# Patient Record
Sex: Female | Born: 2000 | Race: White | Hispanic: No | Marital: Single | Smoking: Never smoker
Health system: Southern US, Community
[De-identification: ages and names within clinical notes are randomized; demographics above are authoritative.]

---
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
## 2020-03-16 ENCOUNTER — Other Ambulatory Visit: Payer: Self-pay

## 2020-03-16 ENCOUNTER — Encounter (HOSPITAL_BASED_OUTPATIENT_CLINIC_OR_DEPARTMENT_OTHER): Payer: Self-pay | Admitting: *Deleted

## 2020-03-16 ENCOUNTER — Emergency Department (HOSPITAL_BASED_OUTPATIENT_CLINIC_OR_DEPARTMENT_OTHER)
Admission: EM | Admit: 2020-03-16 | Discharge: 2020-03-17 | Disposition: A | Discharge status: Home / Self Care | Payer: Managed Care, Other (non HMO) | Attending: Emergency Medicine | Admitting: Emergency Medicine

## 2020-03-16 DIAGNOSIS — R1013 Epigastric pain: Secondary | ICD-10-CM | POA: Diagnosis present

## 2020-03-16 DIAGNOSIS — K2900 Acute gastritis without bleeding: Secondary | ICD-10-CM | POA: Insufficient documentation

## 2020-03-16 LAB — URINALYSIS, ROUTINE W REFLEX MICROSCOPIC
Bilirubin Urine: NEGATIVE
Glucose, UA: NEGATIVE mg/dL
Hgb urine dipstick: NEGATIVE
Ketones, ur: NEGATIVE mg/dL
Leukocytes,Ua: NEGATIVE
Nitrite: NEGATIVE
Protein, ur: NEGATIVE mg/dL
Specific Gravity, Urine: 1.025 (ref 1.005–1.030)
pH: 7 (ref 5.0–8.0)

## 2020-03-16 LAB — LIPASE, BLOOD: Lipase: 37 U/L (ref 11–51)

## 2020-03-16 LAB — COMPREHENSIVE METABOLIC PANEL
ALT: 11 U/L (ref 0–44)
AST: 16 U/L (ref 15–41)
Albumin: 4.4 g/dL (ref 3.5–5.0)
Alkaline Phosphatase: 50 U/L (ref 38–126)
Anion gap: 9 (ref 5–15)
BUN: 11 mg/dL (ref 6–20)
CO2: 28 mmol/L (ref 22–32)
Calcium: 9.3 mg/dL (ref 8.9–10.3)
Chloride: 103 mmol/L (ref 98–111)
Creatinine, Ser: 0.62 mg/dL (ref 0.44–1.00)
GFR, Estimated: >60 mL/min (ref >60)
Glucose, Bld: 102 mg/dL — ABNORMAL HIGH (ref 70–99)
Potassium: 3.4 mmol/L — ABNORMAL LOW (ref 3.5–5.1)
Sodium: 140 mmol/L (ref 135–145)
Total Bilirubin: 0.5 mg/dL (ref 0.3–1.2)
Total Protein: 7.3 g/dL (ref 6.5–8.1)

## 2020-03-16 LAB — CBC
HCT: 40.2 % (ref 36.0–46.0)
Hemoglobin: 13 g/dL (ref 12.0–15.0)
MCH: 30 pg (ref 26.0–34.0)
MCHC: 32.3 g/dL (ref 30.0–36.0)
MCV: 92.8 fL (ref 80.0–100.0)
Platelets: 248 10*3/uL (ref 150–400)
RBC: 4.33 MIL/uL (ref 3.87–5.11)
RDW: 13.4 % (ref 11.5–15.5)
WBC: 9.3 10*3/uL (ref 4.0–10.5)
nRBC: 0 % (ref 0.0–0.2)

## 2020-03-16 LAB — PREGNANCY, URINE: Preg Test, Ur: NEGATIVE

## 2020-03-16 MED ORDER — LIDOCAINE VISCOUS HCL 2 % MT SOLN
15.0000 mL | Freq: Once | OROMUCOSAL | Status: AC
  Administered 2020-03-16: 15 mL via ORAL
  Filled 2020-03-16: qty 15

## 2020-03-16 MED ORDER — ALUM & MAG HYDROXIDE-SIMETH 200-200-20 MG/5ML PO SUSP
30.0000 mL | Freq: Once | ORAL | Status: AC
  Administered 2020-03-16: 30 mL via ORAL
  Filled 2020-03-16: qty 30

## 2020-03-16 MED ORDER — HYOSCYAMINE SULFATE 0.125 MG SL SUBL
0.2500 mg | SUBLINGUAL_TABLET | Freq: Once | SUBLINGUAL | Status: AC
  Administered 2020-03-16: 0.25 mg via SUBLINGUAL
  Filled 2020-03-16: qty 2

## 2020-03-16 MED ORDER — ONDANSETRON HCL 4 MG/2ML IJ SOLN
4.0000 mg | Freq: Once | INTRAMUSCULAR | Status: AC
Start: 2020-03-16 — End: 2020-03-16
  Administered 2020-03-16: 4 mg via INTRAVENOUS
  Filled 2020-03-16: qty 2

## 2020-03-16 NOTE — ED Triage Notes (Signed)
Epigastric pain radiating through to back x 2 days. Denies vomiting, reports nausea

## 2020-03-16 NOTE — ED Provider Notes (Signed)
[facility] EMERGENCY DEPARTMENT Provider Note  CSN: [id number] Arrival date & time: 03/16/20 2153  Chief Complaint(s) Abdominal Pain  HPI Lori Huffman is a 19 y.o. female    HPI Here for 2 days of sharp epigastric abdominal pain Moderate in intensity Radiating to the back Nausea without emesis Worse only with palpation.  No other alleviating or aggravating factors. No change in bowel habits. No fevers or chills.  No chest pain or shortness of breath.  No cough or congestion.   Past Medical History No past medical history on file. Patient Active Problem List   Diagnosis Date Noted  . Hematuria 10/18/2018  . Dysuria 10/18/2018  . Anxiety 05/30/2018  . Acne vulgaris 05/27/2018   Home Medication(s) Prior to Admission medications   Medication Sig Start Date End Date Taking? Authorizing Provider  alum & mag hydroxide-simeth (MAALOX ADVANCED MAX ST) 400-400-40 MG/5ML suspension Take 10 mLs by mouth every 6 (six) hours as needed for indigestion. 03/17/20   CardamaAmadeo Garnet, MD  lidocaine (XYLOCAINE) 2 % solution Use as directed 15 mLs in the mouth or throat as needed for mouth pain. 03/17/20   Nira Conn, MD  phenazopyridine (PYRIDIUM) 100 MG tablet Take 1 tablet (100 mg total) by mouth 3 (three) times daily as needed for pain. 10/18/18   Aviva Signs, CNM  sulfamethoxazole-trimethoprim (BACTRIM DS) 800-160 MG tablet Take 1 tablet by mouth 2 (two) times daily. 10/18/18   Aviva Signs, CNM                                                                                                                                    Past Surgical History No past surgical history on file. Family History No family history on file.  Social History Social History   Tobacco Use  . Smoking status: Never Smoker  . Smokeless tobacco: Never Used  Substance Use Topics  . Alcohol use: No  . Drug use: Yes    Frequency: 3.0 times per week    Types: Marijuana    Allergies Patient has no known allergies.  Review of Systems Review of Systems All other systems are reviewed and are negative for acute change except as noted in the HPI  Physical Exam Vital Signs  I have reviewed the triage vital signs BP (!) 101/53   Pulse 68   Temp 98.6 F (37 C) (Oral)   Resp 17   Ht 5\' 2"  (1.575 m)   Wt 50.8 kg   LMP 02/17/2020   SpO2 100%   BMI 20.49 kg/m   Physical Exam Vitals reviewed.  Constitutional:      General: She is not in acute distress.    Appearance: She is well-developed and well-nourished. She is not diaphoretic.  HENT:     Head: Normocephalic and atraumatic.     Right Ear: External ear normal.     Left Ear: External ear normal.  Nose: Nose normal.  Eyes:     General: No scleral icterus.    Extraocular Movements: EOM normal.     Conjunctiva/sclera: Conjunctivae normal.  Neck:     Trachea: Phonation normal.  Cardiovascular:     Rate and Rhythm: Normal rate and regular rhythm.  Pulmonary:     Effort: Pulmonary effort is normal. No respiratory distress.     Breath sounds: No stridor.  Abdominal:     General: There is no distension.     Tenderness: There is generalized abdominal tenderness (mild discomfort. worse in epigastrum). There is no guarding or rebound. Negative signs include Murphy's sign.  Musculoskeletal:        General: No edema. Normal range of motion.     Cervical back: Normal range of motion.  Neurological:     Mental Status: She is alert and oriented to person, place, and time.  Psychiatric:        Mood and Affect: Mood and affect normal.        Behavior: Behavior normal.     ED Results and Treatments Labs (all labs ordered are listed, but only abnormal results are displayed) Labs Reviewed  COMPREHENSIVE METABOLIC PANEL - Abnormal; Notable for the following components:      Result Value   Potassium 3.4 (*)    Glucose, Bld 102 (*)    All other components within normal limits  URINALYSIS, ROUTINE  W REFLEX MICROSCOPIC - Abnormal; Notable for the following components:   APPearance HAZY (*)    All other components within normal limits  LIPASE, BLOOD  CBC  PREGNANCY, URINE                                                                                                                         EKG  EKG Interpretation  Date/Time:    Ventricular Rate:    PR Interval:    QRS Duration:   QT Interval:    QTC Calculation:   R Axis:     Text Interpretation:        Radiology No results found.  Pertinent labs & imaging results that were available during my care of the patient were reviewed by me and considered in my medical decision making (see chart for details).  Medications Ordered in ED Medications  ondansetron (ZOFRAN) injection 4 mg (4 mg Intravenous Given 03/16/20 2334)  alum & mag hydroxide-simeth (MAALOX/MYLANTA) 200-200-20 MG/5ML suspension 30 mL (30 mLs Oral Given 03/16/20 2334)    And  lidocaine (XYLOCAINE) 2 % viscous mouth solution 15 mL (15 mLs Oral Given 03/16/20 2334)  hyoscyamine (LEVSIN SL) SL tablet 0.25 mg (0.25 mg Sublingual Given 03/16/20 2334)  Procedures Procedures  (including critical care time)  Medical Decision Making / ED Course I have reviewed the nursing notes for this encounter and the patient's prior records (if available in EHR or on provided paperwork).   Lori Huffman was evaluated in Emergency Department on 03/18/2020 for the symptoms described in the history of present illness. She was evaluated in the context of the global COVID-19 pandemic, which necessitated consideration that the patient might be at risk for infection with the SARS-CoV-2 virus that causes COVID-19. Institutional protocols and algorithms that pertain to the evaluation of patients at risk for COVID-19 are in a state of rapid change based on  information released by regulatory bodies including the CDC and federal and state organizations. These policies and algorithms were followed during the patient's care in the ED.    Clinical Course as of 03/18/20 7001  Sat Mar 16, 2020  2354 Patient presents with epigastric abdominal pain Labs drawn in triage are grossly reassuring without leukocytosis or anemia. No significant electrolyte derangements or renal sufficiency. No evidence of bili obstruction or pancreatitis. UPT negative. UA without evidence of infection.  Low suspicion for serious intra-abdominal Fama to assess infectious process or bowel obstruction at this time.  Considering likely gastritis.  Will provide patient with GI cocktail and reassess. [PC]  Sun Mar 17, 2020  0041 Pain significantly improved.  [PC]    Clinical Course User Index [PC] Dontasia Miranda, Amadeo Garnet, MD     Final Clinical Impression(s) / ED Diagnoses Final diagnoses:  Other acute gastritis without hemorrhage    The patient appears reasonably screened and/or stabilized for discharge and I doubt any other medical condition or other [facility] requiring further screening, evaluation, or treatment in the ED at this time prior to discharge. Safe for discharge with strict return precautions.  Disposition: Discharge  Condition: Good  I have discussed the results, Dx and Tx plan with the patient/family who expressed understanding and agree(s) with the plan. Discharge instructions discussed at length. The patient/family was given strict return precautions who verbalized understanding of the instructions. No further questions at time of discharge.    ED Discharge Orders         Ordered    lidocaine (XYLOCAINE) 2 % solution  As needed        03/17/20 0040    alum & mag hydroxide-simeth (MAALOX ADVANCED MAX ST) 400-400-40 MG/5ML suspension  Every 6 hours PRN        03/17/20 0040          Follow Up: Center, [facility] [address]  [place] [address] [phone]  Call  As needed     This chart was dictated using voice recognition software.  Despite best efforts to proofread,  errors can occur which can change the documentation meaning.   Nira Conn, MD 03/18/20 [phone]

## 2020-03-17 MED ORDER — ALUM & MAG HYDROXIDE-SIMETH 400-400-40 MG/5ML PO SUSP: 10.0000 mL | Freq: Four times a day (QID) | ORAL | 0 refills | Status: AC | PRN

## 2020-03-17 MED ORDER — LIDOCAINE VISCOUS HCL 2 % MT SOLN: 15.0000 mL | OROMUCOSAL | 0 refills | Status: AC | PRN

## 2021-09-22 ENCOUNTER — Other Ambulatory Visit: Payer: Self-pay

## 2021-09-22 ENCOUNTER — Encounter (HOSPITAL_BASED_OUTPATIENT_CLINIC_OR_DEPARTMENT_OTHER): Payer: Self-pay | Admitting: *Deleted

## 2021-09-22 DIAGNOSIS — W260XXA Contact with knife, initial encounter: Secondary | ICD-10-CM | POA: Diagnosis not present

## 2021-09-22 DIAGNOSIS — S91112A Laceration without foreign body of left great toe without damage to nail, initial encounter: Secondary | ICD-10-CM | POA: Diagnosis not present

## 2021-09-22 DIAGNOSIS — S99922A Unspecified injury of left foot, initial encounter: Secondary | ICD-10-CM | POA: Diagnosis present
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
## 2021-09-23 ENCOUNTER — Emergency Department (HOSPITAL_BASED_OUTPATIENT_CLINIC_OR_DEPARTMENT_OTHER)
Admission: EM | Admit: 2021-09-23 | Discharge: 2021-09-23 | Disposition: A | Discharge status: Home / Self Care | Payer: 59 | Attending: Emergency Medicine | Admitting: Emergency Medicine

## 2021-09-23 DIAGNOSIS — S91112A Laceration without foreign body of left great toe without damage to nail, initial encounter: Secondary | ICD-10-CM

## 2021-09-23 NOTE — ED Notes (Signed)
Pt verbalizes understanding of discharge instructions. Opportunity for questioning and answers were provided. Pt discharged from ED to home with mother.   ? ?

## 2023-04-30 ENCOUNTER — Other Ambulatory Visit: Payer: Self-pay

## 2023-04-30 ENCOUNTER — Emergency Department (HOSPITAL_BASED_OUTPATIENT_CLINIC_OR_DEPARTMENT_OTHER): Payer: BC Managed Care – PPO | Admitting: Radiology

## 2023-04-30 ENCOUNTER — Encounter (HOSPITAL_BASED_OUTPATIENT_CLINIC_OR_DEPARTMENT_OTHER): Payer: Self-pay | Admitting: Emergency Medicine

## 2023-04-30 ENCOUNTER — Emergency Department (HOSPITAL_BASED_OUTPATIENT_CLINIC_OR_DEPARTMENT_OTHER)
Admission: EM | Admit: 2023-04-30 | Discharge: 2023-04-30 | Disposition: A | Discharge status: Home / Self Care | Payer: BC Managed Care – PPO | Attending: Emergency Medicine | Admitting: Emergency Medicine

## 2023-04-30 ENCOUNTER — Other Ambulatory Visit (HOSPITAL_BASED_OUTPATIENT_CLINIC_OR_DEPARTMENT_OTHER): Payer: Self-pay

## 2023-04-30 DIAGNOSIS — Z20822 Contact with and (suspected) exposure to covid-19: Secondary | ICD-10-CM | POA: Insufficient documentation

## 2023-04-30 DIAGNOSIS — J101 Influenza due to other identified influenza virus with other respiratory manifestations: Secondary | ICD-10-CM | POA: Diagnosis not present

## 2023-04-30 DIAGNOSIS — R079 Chest pain, unspecified: Secondary | ICD-10-CM | POA: Diagnosis not present

## 2023-04-30 DIAGNOSIS — R519 Headache, unspecified: Secondary | ICD-10-CM | POA: Diagnosis present

## 2023-04-30 DIAGNOSIS — J111 Influenza due to unidentified influenza virus with other respiratory manifestations: Secondary | ICD-10-CM

## 2023-04-30 LAB — RESP PANEL BY RT-PCR (RSV, FLU A&B, COVID)  RVPGX2
Influenza A by PCR: POSITIVE — AB
Influenza B by PCR: NEGATIVE
Resp Syncytial Virus by PCR: NEGATIVE
SARS Coronavirus 2 by RT PCR: NEGATIVE

## 2023-04-30 LAB — CBC
HCT: 37.6 % (ref 36.0–46.0)
Hemoglobin: 12.6 g/dL (ref 12.0–15.0)
MCH: 29.9 pg (ref 26.0–34.0)
MCHC: 33.5 g/dL (ref 30.0–36.0)
MCV: 89.3 fL (ref 80.0–100.0)
Platelets: 170 10*3/uL (ref 150–400)
RBC: 4.21 MIL/uL (ref 3.87–5.11)
RDW: 12.7 % (ref 11.5–15.5)
WBC: 7 10*3/uL (ref 4.0–10.5)
nRBC: 0 % (ref 0.0–0.2)

## 2023-04-30 LAB — BASIC METABOLIC PANEL
Anion gap: 13 (ref 5–15)
BUN: 9 mg/dL (ref 6–20)
CO2: 19 mmol/L — ABNORMAL LOW (ref 22–32)
Calcium: 9.5 mg/dL (ref 8.9–10.3)
Chloride: 104 mmol/L (ref 98–111)
Creatinine, Ser: 0.73 mg/dL (ref 0.44–1.00)
GFR, Estimated: >60 mL/min (ref >60)
Glucose, Bld: 97 mg/dL (ref 70–99)
Potassium: 3.8 mmol/L (ref 3.5–5.1)
Sodium: 136 mmol/L (ref 135–145)

## 2023-04-30 LAB — TROPONIN I (HIGH SENSITIVITY): Troponin I (High Sensitivity): <2 ng/L (ref <18)

## 2023-04-30 MED ORDER — ACETAMINOPHEN 325 MG PO TABS
650.0000 mg | ORAL_TABLET | Freq: Once | ORAL | Status: AC
  Administered 2023-04-30: 650 mg via ORAL
  Filled 2023-04-30: qty 2

## 2023-04-30 MED ORDER — IBUPROFEN 400 MG PO TABS
600.0000 mg | ORAL_TABLET | Freq: Once | ORAL | Status: AC
  Administered 2023-04-30: 600 mg via ORAL
  Filled 2023-04-30: qty 1

## 2023-04-30 MED ORDER — ONDANSETRON 4 MG PO TBDP
4.0000 mg | ORAL_TABLET | Freq: Once | ORAL | Status: AC
  Administered 2023-04-30: 4 mg via ORAL
  Filled 2023-04-30: qty 1

## 2023-04-30 MED ORDER — ONDANSETRON HCL 4 MG PO TABS
4.0000 mg | ORAL_TABLET | Freq: Four times a day (QID) | ORAL | 0 refills | Status: AC | PRN
  Filled 2023-04-30: qty 12, 3d supply, fill #0

## 2023-04-30 NOTE — Discharge Instructions (Addendum)
Motrin 600 mg (3 of the 200mg  over the counter pills) every 8 hours. Tylenol 650 mg every 8 hours.  Zofran sent to pharmacy

## 2023-04-30 NOTE — ED Triage Notes (Signed)
Pt c/o headache, nausea and chest pain with dry cough since yesterday. Fever 100.1 this morning. Recent exposure to flu.

## 2023-04-30 NOTE — ED Provider Notes (Signed)
[place] EMERGENCY DEPARTMENT AT [facility] Provider Note   CSN: [id number] Arrival date & time: 04/30/23  1336     History  Chief Complaint  Patient presents with   Nausea   Headache   Chest Pain    Lori Huffman is a 23 y.o. female.  Patient is a 23 yo female presenting for bodyaches, chest pain, headache, coughing, sore throat, and nausea with vomiting. Exposure to friend with Flu.   The history is provided by the patient. No language interpreter was used.  Headache Associated symptoms: cough, nausea, sore throat and vomiting   Associated symptoms: no abdominal pain, no back pain, no congestion, no ear pain, no eye pain, no fever and no seizures   Chest Pain Associated symptoms: cough, headache, nausea, shortness of breath and vomiting   Associated symptoms: no abdominal pain, no back pain, no fever and no palpitations        Home Medications Prior to Admission medications   Medication Sig Start Date End Date Taking? Authorizing Provider  ondansetron (ZOFRAN) 4 MG tablet Take 1 tablet (4 mg total) by mouth every 6 (six) hours as needed for nausea or vomiting. 04/30/23  Yes Edwin Dada P, DO  alum & mag hydroxide-simeth (MAALOX ADVANCED MAX ST) 400-400-40 MG/5ML suspension Take 10 mLs by mouth every 6 (six) hours as needed for indigestion. 03/17/20   CardamaAmadeo Garnet, MD  lidocaine (XYLOCAINE) 2 % solution Use as directed 15 mLs in the mouth or throat as needed for mouth pain. 03/17/20   Nira Conn, MD  phenazopyridine (PYRIDIUM) 100 MG tablet Take 1 tablet (100 mg total) by mouth 3 (three) times daily as needed for pain. 10/18/18   Aviva Signs, CNM  sulfamethoxazole-trimethoprim (BACTRIM DS) 800-160 MG tablet Take 1 tablet by mouth 2 (two) times daily. 10/18/18   Aviva Signs, CNM      Allergies    Patient has no known allergies.    Review of Systems   Review of Systems  Constitutional:  Negative for chills and fever.  HENT:   Positive for sore throat. Negative for congestion and ear pain.   Eyes:  Negative for pain and visual disturbance.  Respiratory:  Positive for cough and shortness of breath.   Cardiovascular:  Positive for chest pain. Negative for palpitations.  Gastrointestinal:  Positive for nausea and vomiting. Negative for abdominal pain.  Genitourinary:  Negative for dysuria and hematuria.  Musculoskeletal:  Negative for arthralgias and back pain.  Skin:  Negative for color change and rash.  Neurological:  Positive for headaches. Negative for seizures and syncope.  All other systems reviewed and are negative.   Physical Exam Updated Vital Signs BP (!) 96/58 (BP Location: Right Arm)   Pulse 78   Temp 98.2 F (36.8 C) (Oral)   Resp 16   Ht 5\' 2"  (1.575 m)   Wt 61.2 kg   LMP 04/28/2023   SpO2 99%   BMI 24.69 kg/m  Physical Exam Vitals and nursing note reviewed.  Constitutional:      General: She is not in acute distress.    Appearance: She is well-developed.  HENT:     Head: Normocephalic and atraumatic.  Eyes:     Conjunctiva/sclera: Conjunctivae normal.  Cardiovascular:     Rate and Rhythm: Normal rate and regular rhythm.     Heart sounds: No murmur heard. Pulmonary:     Effort: Pulmonary effort is normal. No respiratory distress.     Breath sounds:  Normal breath sounds.  Abdominal:     Palpations: Abdomen is soft.     Tenderness: There is no abdominal tenderness.  Musculoskeletal:        General: No swelling.     Cervical back: Neck supple.  Skin:    General: Skin is warm and dry.     Capillary Refill: Capillary refill takes less than 2 seconds.  Neurological:     Mental Status: She is alert.  Psychiatric:        Mood and Affect: Mood normal.     ED Results / Procedures / Treatments   Labs (all labs ordered are listed, but only abnormal results are displayed) Labs Reviewed  RESP PANEL BY RT-PCR (RSV, FLU A&B, COVID)  RVPGX2 - Abnormal; Notable for the following  components:      Result Value   Influenza A by PCR POSITIVE (*)    All other components within normal limits  BASIC METABOLIC PANEL - Abnormal; Notable for the following components:   CO2 19 (*)    All other components within normal limits  CBC  TROPONIN I (HIGH SENSITIVITY)  TROPONIN I (HIGH SENSITIVITY)    EKG EKG Interpretation Date/Time:  Friday April 30 2023 14:42:38 EST Ventricular Rate:  91 PR Interval:  132 QRS Duration:  74 QT Interval:  338 QTC Calculation: 415 R Axis:   91  Text Interpretation: Normal sinus rhythm with sinus arrhythmia Rightward axis When compared with ECG of 02-Sep-2016 04:36, PREVIOUS ECG IS PRESENT No STEMI Confirmed by Beckey Downing [phone]) on 04/30/2023 2:47:18 PM  Radiology DG Chest 2 View Result Date: 04/30/2023 CLINICAL DATA:  Cough.  Headache.  Shortness of breath. EXAM: CHEST - 2 VIEW COMPARISON:  09/02/2016 FINDINGS: Midline trachea. Normal heart size and mediastinal contours. No pleural effusion or pneumothorax. Clear lungs. IMPRESSION: No active cardiopulmonary disease. Electronically Signed   By: Jeronimo Greaves M.D.   On: 04/30/2023 15:37    Procedures Procedures    Medications Ordered in ED Medications  ondansetron (ZOFRAN-ODT) disintegrating tablet 4 mg (4 mg Oral Given 04/30/23 1618)  acetaminophen (TYLENOL) tablet 650 mg (650 mg Oral Given 04/30/23 1616)  ibuprofen (ADVIL) tablet 600 mg (600 mg Oral Given 04/30/23 1616)    ED Course/ Medical Decision Making/ A&P                                 Medical Decision Making Amount and/or Complexity of Data Reviewed Labs: ordered. Radiology: ordered.  Risk OTC drugs. Prescription drug management.   Patient is alert and oriented x 3, no acute distress, afebrile, stable vital signs presenting for chest pain, headache, coughing, sore throat, nausea, vomiting, body aches.  Flu positive.  Tamiflu offered and declined.  Zofran given for nausea.  Motrin Tylenol given for body aches and  fever.  Patient stable and well-appearing otherwise.  Patient recommended for rest, increase hydration, vitamin C, over-the-counter cough medication/throat lozenges/honey with tea.  Stable EKG and electrolytes. EKG as interpreted by myself demonstrates normal sinus rhythm.  No ST segment elevation or depression.  Stable intervals.  Patient in no distress and overall condition improved here in the ED. Detailed discussions were had with the patient regarding current findings, and need for close f/u with PCP or on call doctor. The patient has been instructed to return immediately if the symptoms worsen in any way for re-evaluation. Patient verbalized understanding and is in agreement with current care plan.  All questions answered prior to discharge.         Final Clinical Impression(s) / ED Diagnoses Final diagnoses:  Influenza    Rx / DC Orders ED Discharge Orders          Ordered    ondansetron (ZOFRAN) 4 MG tablet  Every 6 hours PRN        04/30/23 1609              Franne Forts, DO 04/30/23 1628
